# Patient Record
Sex: Female | Born: 1980 | Race: White | Hispanic: No | Marital: Single | Smoking: Never smoker
Health system: Southern US, Community
[De-identification: ages and names within clinical notes are randomized; demographics above are authoritative.]

---
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
## 1999-04-28 ENCOUNTER — Emergency Department (HOSPITAL_COMMUNITY): Admission: EM | Admit: 1999-04-28 | Discharge: 1999-04-28 | Payer: Self-pay | Admitting: Emergency Medicine

## 1999-04-28 ENCOUNTER — Encounter: Payer: Self-pay | Admitting: Emergency Medicine

## 1999-05-17 ENCOUNTER — Emergency Department (HOSPITAL_COMMUNITY): Admission: EM | Admit: 1999-05-17 | Discharge: 1999-05-17 | Payer: Self-pay | Admitting: Emergency Medicine

## 2000-07-25 ENCOUNTER — Other Ambulatory Visit: Admission: RE | Admit: 2000-07-25 | Discharge: 2000-07-25 | Payer: Self-pay | Admitting: *Deleted

## 2000-08-22 ENCOUNTER — Other Ambulatory Visit: Admission: RE | Admit: 2000-08-22 | Discharge: 2000-08-22 | Payer: Self-pay | Admitting: *Deleted

## 2001-05-16 ENCOUNTER — Other Ambulatory Visit: Admission: RE | Admit: 2001-05-16 | Discharge: 2001-05-16 | Payer: Self-pay | Admitting: *Deleted

## 2002-06-28 ENCOUNTER — Other Ambulatory Visit: Admission: RE | Admit: 2002-06-28 | Discharge: 2002-06-28 | Payer: Self-pay | Admitting: *Deleted

## 2003-08-02 ENCOUNTER — Other Ambulatory Visit: Admission: RE | Admit: 2003-08-02 | Discharge: 2003-08-02 | Payer: Self-pay | Admitting: *Deleted

## 2004-08-05 ENCOUNTER — Other Ambulatory Visit: Admission: RE | Admit: 2004-08-05 | Discharge: 2004-08-05 | Payer: Self-pay | Admitting: Obstetrics and Gynecology

## 2005-08-09 ENCOUNTER — Other Ambulatory Visit: Admission: RE | Admit: 2005-08-09 | Discharge: 2005-08-09 | Payer: Self-pay | Admitting: Obstetrics and Gynecology

## 2006-09-16 ENCOUNTER — Other Ambulatory Visit: Admission: RE | Admit: 2006-09-16 | Discharge: 2006-09-16 | Payer: Self-pay | Admitting: Obstetrics and Gynecology

## 2008-03-18 ENCOUNTER — Emergency Department (HOSPITAL_BASED_OUTPATIENT_CLINIC_OR_DEPARTMENT_OTHER): Admission: EM | Admit: 2008-03-18 | Discharge: 2008-03-18 | Payer: Self-pay | Admitting: Emergency Medicine

## 2009-04-24 ENCOUNTER — Inpatient Hospital Stay (HOSPITAL_COMMUNITY): Admission: AD | Admit: 2009-04-24 | Discharge: 2009-04-28 | Payer: Self-pay | Admitting: Obstetrics and Gynecology

## 2009-09-03 IMAGING — CT CT HEAD W/O CM
2 series · 16 of 30 positions shown, 18 images · non-contrast
Comparison: None

CLINICAL DATA: Seizure.

CT HEAD WITHOUT CONTRAST
TECHNIQUE: Contiguous axial images were obtained from the base of
the skull through the vertex without contrast.

[Series 2: head 4.8 h37s · axial · 0.45mm/px · z∈[+934,+1054]mm · 8 of 32 slices shown, 10 images]
[im 4/32  brain]
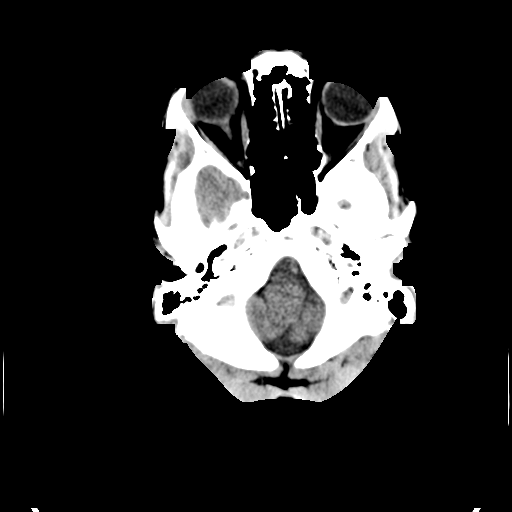
[im 4/32  bone]
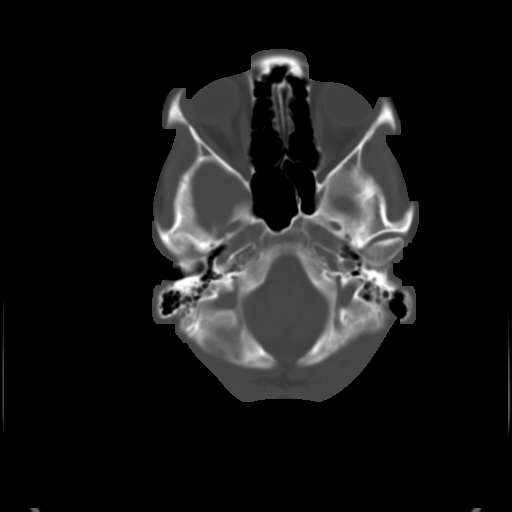
[im 7/32  brain]
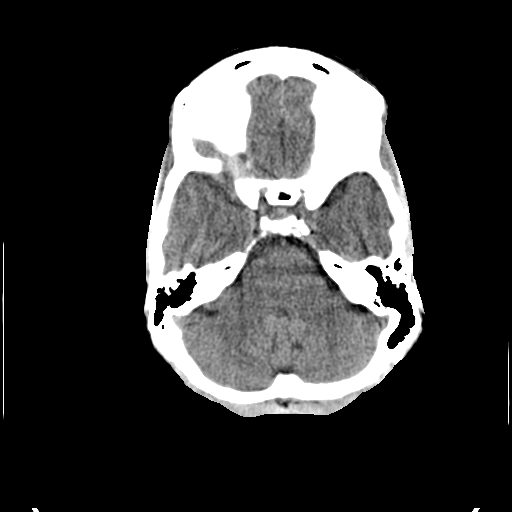
[im 11/32  brain]
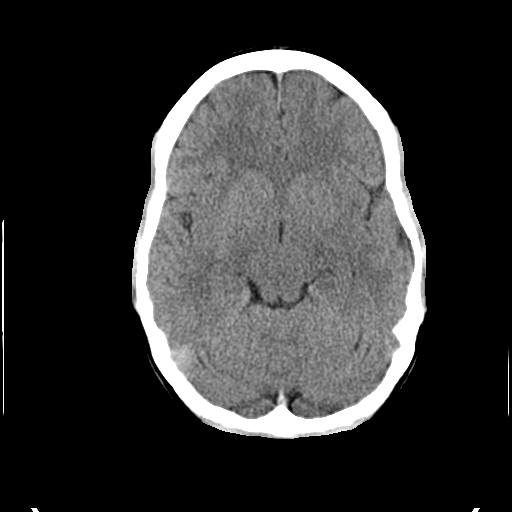
[im 14/32  brain]
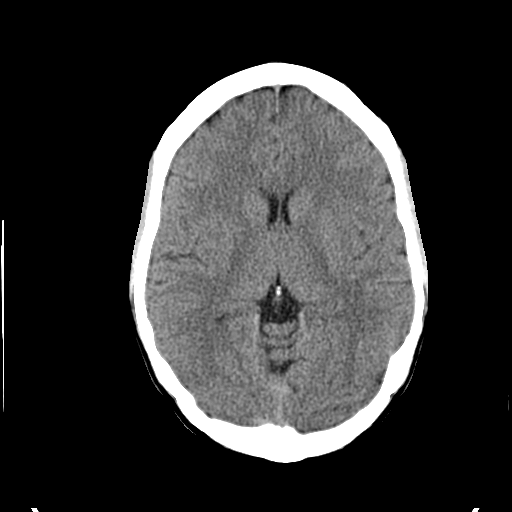
[im 18/32  brain]
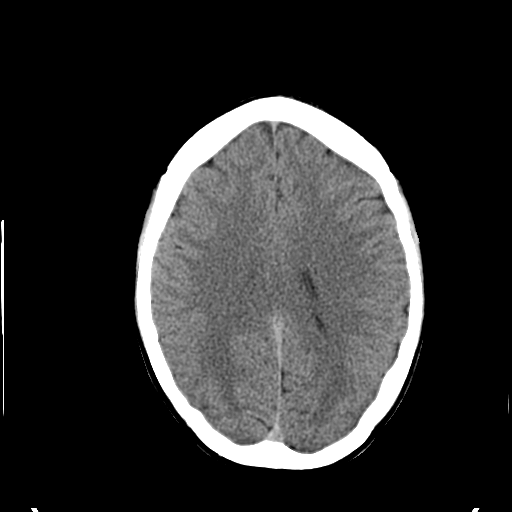
[im 18/32  bone]
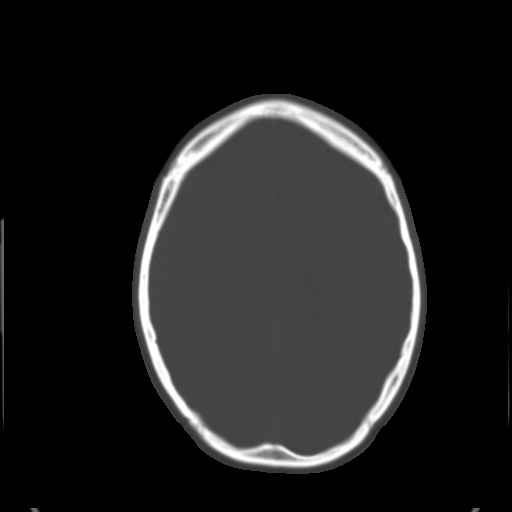
[im 21/32  brain]
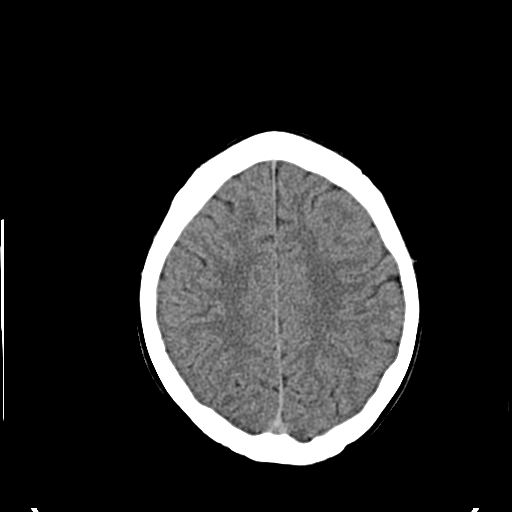
[im 25/32  brain]
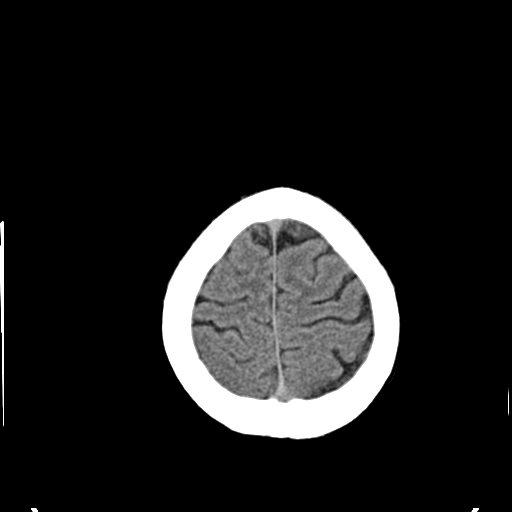
[im 28/32  brain]
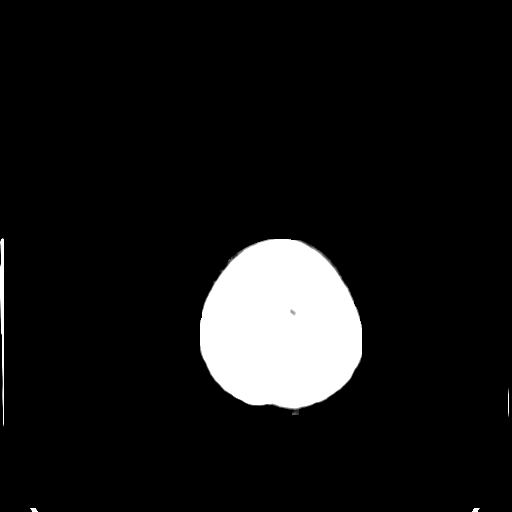

[Series 3: head 2.4 h60s bone · axial · 0.45mm/px · z∈[+933,+1058]mm · 8 of 64 slices shown]
[im 7/64  bone]
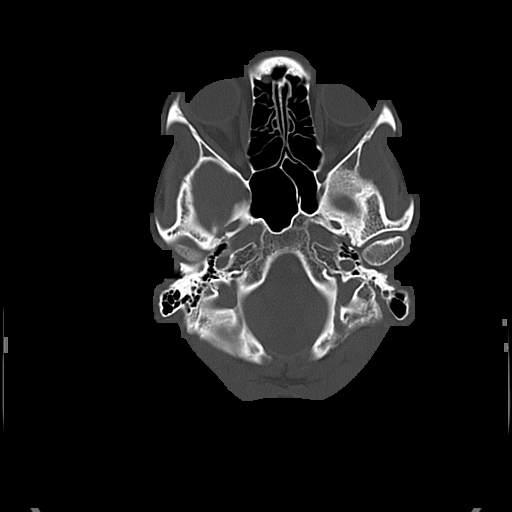
[im 14/64  bone]
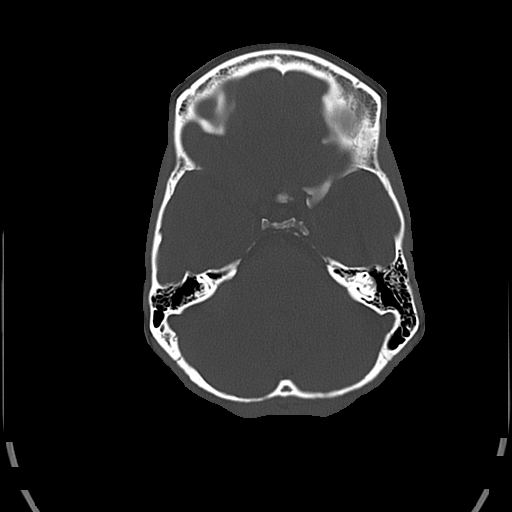
[im 20/64  bone]
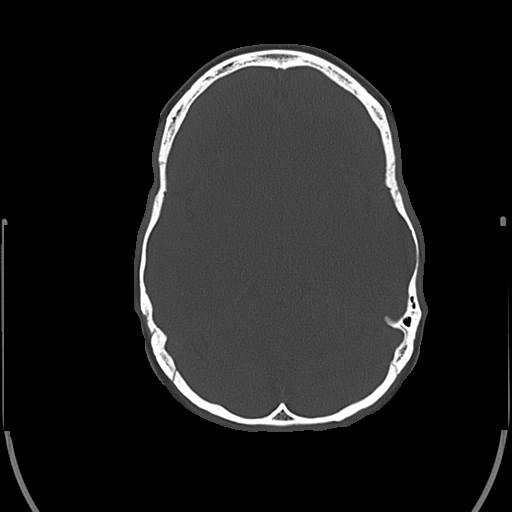
[im 27/64  bone]
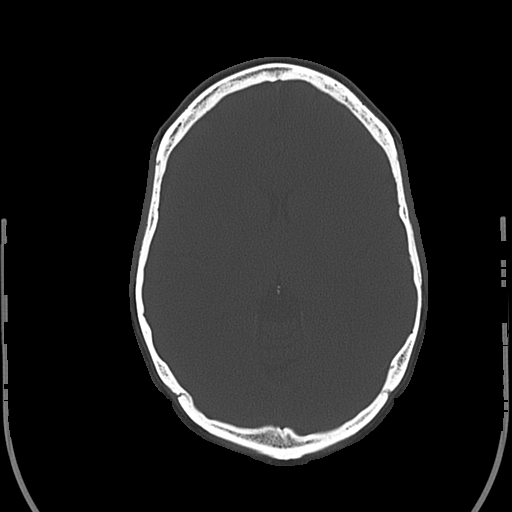
[im 37/64  bone]
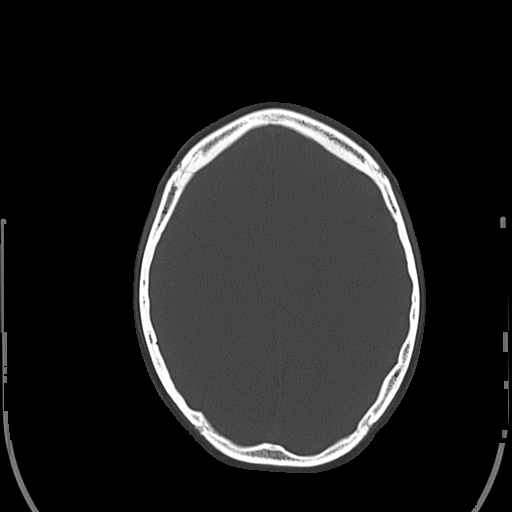
[im 44/64  bone]
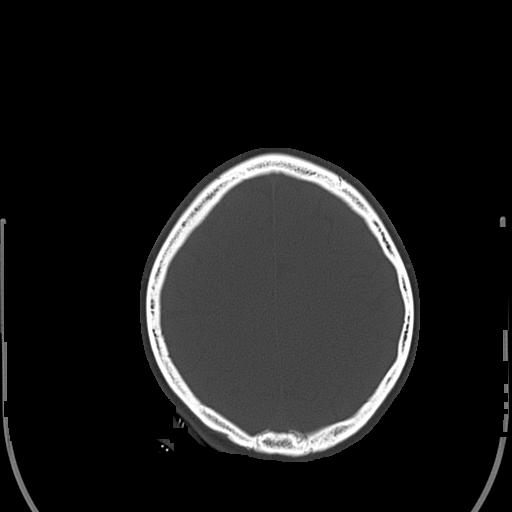
[im 50/64  bone]
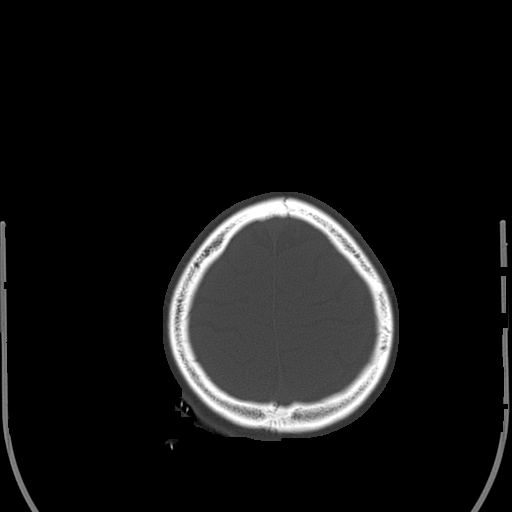
[im 57/64  bone]
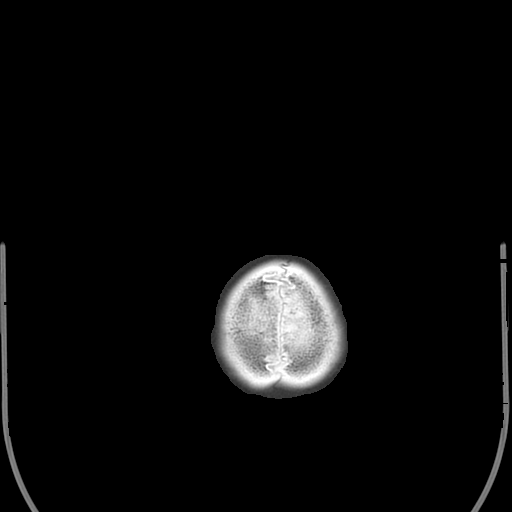

[16 of 30 positions shown; findings below may reference images not displayed]

FINDINGS: No acute intracranial abnormalities are identified,
including mass lesion or mass effect, hydrocephalus, extra-axial
fluid collection, midline shift, hemorrhage, or acute infarction.
Please note that acute infarction may be occult on CT for 24-48
hours.

The visualized bony calvarium is unremarkable.
IMPRESSION: No evidence of acute intracranial abnormality.

## 2010-09-09 LAB — RPR: RPR Ser Ql: NONREACTIVE

## 2010-09-09 LAB — CBC
HCT: 37.3 % (ref 36.0–46.0)
HCT: 40.8 % (ref 36.0–46.0)
Hemoglobin: 12.8 g/dL (ref 12.0–15.0)
Hemoglobin: 13.8 g/dL (ref 12.0–15.0)
MCHC: 33.8 g/dL (ref 30.0–36.0)
MCHC: 34.2 g/dL (ref 30.0–36.0)
MCV: 95.6 fL (ref 78.0–100.0)
MCV: 96.4 fL (ref 78.0–100.0)
Platelets: 150 10*3/uL (ref 150–400)
Platelets: 182 10*3/uL (ref 150–400)
RBC: 3.87 MIL/uL (ref 3.87–5.11)
RBC: 4.27 MIL/uL (ref 3.87–5.11)
RDW: 13.6 % (ref 11.5–15.5)
RDW: 13.6 % (ref 11.5–15.5)
WBC: 10.9 10*3/uL — ABNORMAL HIGH (ref 4.0–10.5)
WBC: 8.2 10*3/uL (ref 4.0–10.5)

## 2010-09-09 LAB — RH IMMUNE GLOB WKUP(>/=20WKS)(NOT WOMEN'S HOSP)

## 2011-03-09 LAB — COMPREHENSIVE METABOLIC PANEL
AST: 37
BUN: 13
CO2: 26
Chloride: 102
Creatinine, Ser: 0.7
GFR calc Af Amer: >60
GFR calc non Af Amer: >60
Total Bilirubin: 0.5

## 2011-03-09 LAB — URINE MICROSCOPIC-ADD ON

## 2011-03-09 LAB — URINALYSIS, ROUTINE W REFLEX MICROSCOPIC
Glucose, UA: NEGATIVE
Hgb urine dipstick: NEGATIVE
Protein, ur: 100 — AB

## 2011-03-09 LAB — DIFFERENTIAL
Basophils Absolute: 0
Lymphocytes Relative: 5 — ABNORMAL LOW
Monocytes Relative: 3
Neutrophils Relative %: 92 — ABNORMAL HIGH

## 2011-03-09 LAB — CBC
Hemoglobin: 14.1
Platelets: 247
RDW: 12.8

## 2011-03-09 LAB — POCT TOXICOLOGY PANEL

## 2011-03-09 LAB — PROTIME-INR: INR: 1.1

## 2011-07-19 ENCOUNTER — Other Ambulatory Visit: Payer: Self-pay | Admitting: Physician Assistant

## 2011-07-19 ENCOUNTER — Ambulatory Visit
Admission: RE | Admit: 2011-07-19 | Discharge: 2011-07-19 | Disposition: A | Discharge status: Home / Self Care | Payer: Medicaid Other | Source: Ambulatory Visit | Attending: Physician Assistant | Admitting: Physician Assistant

## 2012-01-17 ENCOUNTER — Other Ambulatory Visit: Payer: Self-pay | Admitting: Family Medicine

## 2012-01-17 DIAGNOSIS — R1013 Epigastric pain: Secondary | ICD-10-CM

## 2012-01-18 ENCOUNTER — Ambulatory Visit
Admission: RE | Admit: 2012-01-18 | Discharge: 2012-01-18 | Disposition: A | Discharge status: Home / Self Care | Payer: Medicaid Other | Source: Ambulatory Visit | Attending: Family Medicine | Admitting: Family Medicine

## 2012-01-18 DIAGNOSIS — R1013 Epigastric pain: Secondary | ICD-10-CM

## 2012-01-21 ENCOUNTER — Other Ambulatory Visit (HOSPITAL_COMMUNITY): Payer: Self-pay | Admitting: Family Medicine

## 2012-01-21 DIAGNOSIS — R110 Nausea: Secondary | ICD-10-CM

## 2012-02-01 ENCOUNTER — Encounter (HOSPITAL_COMMUNITY)
Admission: RE | Admit: 2012-02-01 | Discharge: 2012-02-01 | Disposition: A | Discharge status: Home / Self Care | Payer: Medicaid Other | Source: Ambulatory Visit | Attending: Family Medicine | Admitting: Family Medicine

## 2012-02-01 DIAGNOSIS — R110 Nausea: Secondary | ICD-10-CM

## 2012-02-01 MED ORDER — TECHNETIUM TC 99M SULFUR COLLOID
2.2000 | Freq: Once | INTRAVENOUS | Status: AC | PRN
  Administered 2012-02-01: 2.2 via INTRAVENOUS

## 2012-02-25 ENCOUNTER — Other Ambulatory Visit (HOSPITAL_COMMUNITY): Payer: Self-pay | Admitting: Gastroenterology

## 2012-02-25 DIAGNOSIS — R109 Unspecified abdominal pain: Secondary | ICD-10-CM

## 2012-03-06 ENCOUNTER — Encounter (HOSPITAL_COMMUNITY)
Admission: RE | Admit: 2012-03-06 | Discharge: 2012-03-06 | Disposition: A | Discharge status: Home / Self Care | Payer: Medicaid Other | Source: Ambulatory Visit | Attending: Gastroenterology | Admitting: Gastroenterology

## 2012-03-06 DIAGNOSIS — R109 Unspecified abdominal pain: Secondary | ICD-10-CM

## 2012-03-06 MED ORDER — TECHNETIUM TC 99M MEBROFENIN IV KIT
5.0000 | PACK | Freq: Once | INTRAVENOUS | Status: AC | PRN
  Administered 2012-03-06: 5 via INTRAVENOUS

## 2012-03-06 MED ORDER — SINCALIDE 5 MCG IJ SOLR
0.0200 ug/kg | Freq: Once | INTRAMUSCULAR | Status: AC
  Administered 2012-03-06: 0.9 ug via INTRAVENOUS

## 2012-03-06 NOTE — Therapy (Signed)
Kinevac administered to patient Kayla Hudson per radiology protocol. Patient identifiers used prior to given medication and dosage verified by Robin Searing RN.

## 2013-08-22 IMAGING — NM NM HEPATO W/GB/PHARM/[PERSON_NAME]
2 series · 12 of 12 positions shown · non-contrast
Comparison: Ultrasound 01/18/2012

CLINICAL DATA: Abdominal pain

NUCLEAR MEDICINE HEPATOBILIARY IMAGING WITH GALLBLADDER EF
TECHNIQUE: Sequential images of the abdomen were obtained [DATE]
minutes following intravenous administration of
radiopharmaceutical.  After slow intravenous infusion of 0.9 ucg
Cholecystokinin, gallbladder ejection fraction was determined.
Radiopharmaceutical:  5.0 mCi Hc-RRm Choletec

[he hepatobiliary · 3.43mm/px · 6 of 54 frames shown (1 of 2)]
[frame 5/54]
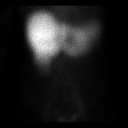
[frame 14/54]
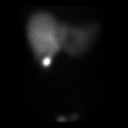
[frame 23/54]
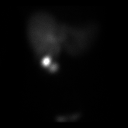
[frame 32/54]
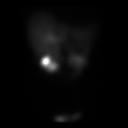
[frame 41/54]
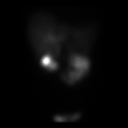
[frame 50/54]
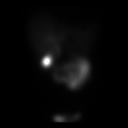

[he hepatobiliary · 3.43mm/px · 6 of 30 frames shown (2 of 2)]
[frame 3/30]
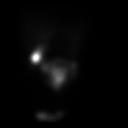
[frame 8/30]
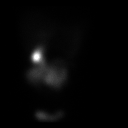
[frame 13/30]
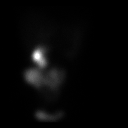
[frame 18/30]
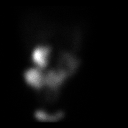
[frame 23/30]
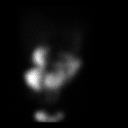
[frame 28/30]
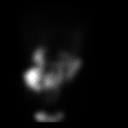

[12 of 12 positions shown; findings below may reference images not displayed]

FINDINGS: There is prompt extraction of radiotracer from the blood
pool and homogeneous uptake within the liver.  The gallbladder is
evident by 15 minutes.  Counts are present within the bowel by 35
minutes.  Upon administration of a cholecystokinin, the gallbladder
contracts appropriately with a calculated ejection fraction = 79%
at 30 min (Normal greater than 30 % ejection).
IMPRESSION: 1. Normal gallbladder ejection fraction =  79%.
2. Patent cystic duct and common bile duct.

## 2015-10-15 DIAGNOSIS — R51 Headache: Secondary | ICD-10-CM | POA: Diagnosis not present

## 2015-10-21 DIAGNOSIS — R51 Headache: Secondary | ICD-10-CM | POA: Diagnosis not present

## 2015-10-21 DIAGNOSIS — M545 Low back pain: Secondary | ICD-10-CM | POA: Diagnosis not present

## 2015-10-21 DIAGNOSIS — M542 Cervicalgia: Secondary | ICD-10-CM | POA: Diagnosis not present

## 2015-10-21 DIAGNOSIS — M25561 Pain in right knee: Secondary | ICD-10-CM | POA: Diagnosis not present

## 2015-10-24 DIAGNOSIS — M542 Cervicalgia: Secondary | ICD-10-CM | POA: Diagnosis not present

## 2015-10-24 DIAGNOSIS — R51 Headache: Secondary | ICD-10-CM | POA: Diagnosis not present

## 2015-10-29 DIAGNOSIS — M542 Cervicalgia: Secondary | ICD-10-CM | POA: Diagnosis not present

## 2015-10-29 DIAGNOSIS — R51 Headache: Secondary | ICD-10-CM | POA: Diagnosis not present

## 2015-11-04 DIAGNOSIS — M542 Cervicalgia: Secondary | ICD-10-CM | POA: Diagnosis not present

## 2015-11-04 DIAGNOSIS — R51 Headache: Secondary | ICD-10-CM | POA: Diagnosis not present

## 2015-11-06 DIAGNOSIS — R51 Headache: Secondary | ICD-10-CM | POA: Diagnosis not present

## 2015-11-06 DIAGNOSIS — M545 Low back pain: Secondary | ICD-10-CM | POA: Diagnosis not present

## 2015-11-06 DIAGNOSIS — M25561 Pain in right knee: Secondary | ICD-10-CM | POA: Diagnosis not present

## 2015-11-06 DIAGNOSIS — M542 Cervicalgia: Secondary | ICD-10-CM | POA: Diagnosis not present

## 2015-11-10 DIAGNOSIS — M545 Low back pain: Secondary | ICD-10-CM | POA: Diagnosis not present

## 2015-11-10 DIAGNOSIS — M25561 Pain in right knee: Secondary | ICD-10-CM | POA: Diagnosis not present

## 2015-11-10 DIAGNOSIS — R51 Headache: Secondary | ICD-10-CM | POA: Diagnosis not present

## 2015-11-10 DIAGNOSIS — M542 Cervicalgia: Secondary | ICD-10-CM | POA: Diagnosis not present

## 2015-11-18 DIAGNOSIS — M25561 Pain in right knee: Secondary | ICD-10-CM | POA: Diagnosis not present

## 2015-11-18 DIAGNOSIS — R51 Headache: Secondary | ICD-10-CM | POA: Diagnosis not present

## 2015-11-18 DIAGNOSIS — M542 Cervicalgia: Secondary | ICD-10-CM | POA: Diagnosis not present

## 2015-11-18 DIAGNOSIS — M545 Low back pain: Secondary | ICD-10-CM | POA: Diagnosis not present

## 2015-11-20 DIAGNOSIS — R51 Headache: Secondary | ICD-10-CM | POA: Diagnosis not present

## 2015-11-20 DIAGNOSIS — M25561 Pain in right knee: Secondary | ICD-10-CM | POA: Diagnosis not present

## 2015-11-20 DIAGNOSIS — M542 Cervicalgia: Secondary | ICD-10-CM | POA: Diagnosis not present

## 2015-11-20 DIAGNOSIS — M545 Low back pain: Secondary | ICD-10-CM | POA: Diagnosis not present

## 2015-11-24 DIAGNOSIS — R51 Headache: Secondary | ICD-10-CM | POA: Diagnosis not present

## 2015-11-24 DIAGNOSIS — M545 Low back pain: Secondary | ICD-10-CM | POA: Diagnosis not present

## 2015-11-24 DIAGNOSIS — M542 Cervicalgia: Secondary | ICD-10-CM | POA: Diagnosis not present

## 2015-11-24 DIAGNOSIS — M25561 Pain in right knee: Secondary | ICD-10-CM | POA: Diagnosis not present

## 2015-11-26 DIAGNOSIS — R51 Headache: Secondary | ICD-10-CM | POA: Diagnosis not present

## 2015-11-26 DIAGNOSIS — M545 Low back pain: Secondary | ICD-10-CM | POA: Diagnosis not present

## 2015-11-26 DIAGNOSIS — M542 Cervicalgia: Secondary | ICD-10-CM | POA: Diagnosis not present

## 2015-11-26 DIAGNOSIS — M25561 Pain in right knee: Secondary | ICD-10-CM | POA: Diagnosis not present

## 2015-12-01 DIAGNOSIS — R51 Headache: Secondary | ICD-10-CM | POA: Diagnosis not present

## 2015-12-01 DIAGNOSIS — M542 Cervicalgia: Secondary | ICD-10-CM | POA: Diagnosis not present

## 2015-12-01 DIAGNOSIS — M25561 Pain in right knee: Secondary | ICD-10-CM | POA: Diagnosis not present

## 2015-12-01 DIAGNOSIS — M545 Low back pain: Secondary | ICD-10-CM | POA: Diagnosis not present

## 2015-12-02 DIAGNOSIS — M25561 Pain in right knee: Secondary | ICD-10-CM | POA: Diagnosis not present

## 2015-12-02 DIAGNOSIS — M545 Low back pain: Secondary | ICD-10-CM | POA: Diagnosis not present

## 2015-12-02 DIAGNOSIS — M542 Cervicalgia: Secondary | ICD-10-CM | POA: Diagnosis not present

## 2015-12-02 DIAGNOSIS — R51 Headache: Secondary | ICD-10-CM | POA: Diagnosis not present

## 2015-12-16 DIAGNOSIS — M545 Low back pain: Secondary | ICD-10-CM | POA: Diagnosis not present

## 2015-12-16 DIAGNOSIS — R51 Headache: Secondary | ICD-10-CM | POA: Diagnosis not present

## 2015-12-16 DIAGNOSIS — M542 Cervicalgia: Secondary | ICD-10-CM | POA: Diagnosis not present

## 2015-12-16 DIAGNOSIS — M25561 Pain in right knee: Secondary | ICD-10-CM | POA: Diagnosis not present

## 2015-12-18 DIAGNOSIS — M545 Low back pain: Secondary | ICD-10-CM | POA: Diagnosis not present

## 2015-12-18 DIAGNOSIS — M542 Cervicalgia: Secondary | ICD-10-CM | POA: Diagnosis not present

## 2015-12-18 DIAGNOSIS — R51 Headache: Secondary | ICD-10-CM | POA: Diagnosis not present

## 2015-12-18 DIAGNOSIS — M25561 Pain in right knee: Secondary | ICD-10-CM | POA: Diagnosis not present

## 2015-12-23 DIAGNOSIS — M545 Low back pain: Secondary | ICD-10-CM | POA: Diagnosis not present

## 2015-12-23 DIAGNOSIS — M542 Cervicalgia: Secondary | ICD-10-CM | POA: Diagnosis not present

## 2015-12-23 DIAGNOSIS — M25561 Pain in right knee: Secondary | ICD-10-CM | POA: Diagnosis not present

## 2015-12-23 DIAGNOSIS — R51 Headache: Secondary | ICD-10-CM | POA: Diagnosis not present

## 2015-12-31 DIAGNOSIS — M542 Cervicalgia: Secondary | ICD-10-CM | POA: Diagnosis not present

## 2015-12-31 DIAGNOSIS — R51 Headache: Secondary | ICD-10-CM | POA: Diagnosis not present

## 2015-12-31 DIAGNOSIS — M545 Low back pain: Secondary | ICD-10-CM | POA: Diagnosis not present

## 2015-12-31 DIAGNOSIS — M25561 Pain in right knee: Secondary | ICD-10-CM | POA: Diagnosis not present

## 2016-01-06 DIAGNOSIS — M542 Cervicalgia: Secondary | ICD-10-CM | POA: Diagnosis not present

## 2016-01-06 DIAGNOSIS — M545 Low back pain: Secondary | ICD-10-CM | POA: Diagnosis not present

## 2016-01-06 DIAGNOSIS — M25561 Pain in right knee: Secondary | ICD-10-CM | POA: Diagnosis not present

## 2016-01-06 DIAGNOSIS — R51 Headache: Secondary | ICD-10-CM | POA: Diagnosis not present

## 2016-01-08 DIAGNOSIS — R51 Headache: Secondary | ICD-10-CM | POA: Diagnosis not present

## 2016-01-08 DIAGNOSIS — M25561 Pain in right knee: Secondary | ICD-10-CM | POA: Diagnosis not present

## 2016-01-08 DIAGNOSIS — M542 Cervicalgia: Secondary | ICD-10-CM | POA: Diagnosis not present

## 2016-01-08 DIAGNOSIS — M545 Low back pain: Secondary | ICD-10-CM | POA: Diagnosis not present

## 2016-01-13 DIAGNOSIS — M545 Low back pain: Secondary | ICD-10-CM | POA: Diagnosis not present

## 2016-01-13 DIAGNOSIS — R51 Headache: Secondary | ICD-10-CM | POA: Diagnosis not present

## 2016-01-13 DIAGNOSIS — M25561 Pain in right knee: Secondary | ICD-10-CM | POA: Diagnosis not present

## 2016-01-13 DIAGNOSIS — M542 Cervicalgia: Secondary | ICD-10-CM | POA: Diagnosis not present

## 2016-02-18 DIAGNOSIS — K582 Mixed irritable bowel syndrome: Secondary | ICD-10-CM | POA: Diagnosis not present

## 2016-02-24 DIAGNOSIS — J309 Allergic rhinitis, unspecified: Secondary | ICD-10-CM | POA: Diagnosis not present

## 2016-12-14 DIAGNOSIS — Z01419 Encounter for gynecological examination (general) (routine) without abnormal findings: Secondary | ICD-10-CM | POA: Diagnosis not present

## 2016-12-14 DIAGNOSIS — Z681 Body mass index (BMI) 19 or less, adult: Secondary | ICD-10-CM | POA: Diagnosis not present

## 2017-01-15 DIAGNOSIS — F900 Attention-deficit hyperactivity disorder, predominantly inattentive type: Secondary | ICD-10-CM | POA: Diagnosis not present

## 2017-01-15 DIAGNOSIS — F3174 Bipolar disorder, in full remission, most recent episode manic: Secondary | ICD-10-CM | POA: Diagnosis not present

## 2017-01-15 DIAGNOSIS — F3176 Bipolar disorder, in full remission, most recent episode depressed: Secondary | ICD-10-CM | POA: Diagnosis not present

## 2017-07-09 DIAGNOSIS — F900 Attention-deficit hyperactivity disorder, predominantly inattentive type: Secondary | ICD-10-CM | POA: Diagnosis not present

## 2017-07-09 DIAGNOSIS — F3174 Bipolar disorder, in full remission, most recent episode manic: Secondary | ICD-10-CM | POA: Diagnosis not present

## 2017-07-09 DIAGNOSIS — F3176 Bipolar disorder, in full remission, most recent episode depressed: Secondary | ICD-10-CM | POA: Diagnosis not present

## 2017-08-16 DIAGNOSIS — B009 Herpesviral infection, unspecified: Secondary | ICD-10-CM | POA: Diagnosis not present

## 2017-12-31 DIAGNOSIS — F900 Attention-deficit hyperactivity disorder, predominantly inattentive type: Secondary | ICD-10-CM | POA: Diagnosis not present

## 2017-12-31 DIAGNOSIS — F3181 Bipolar II disorder: Secondary | ICD-10-CM | POA: Diagnosis not present

## 2018-02-22 DIAGNOSIS — Z8041 Family history of malignant neoplasm of ovary: Secondary | ICD-10-CM | POA: Diagnosis not present

## 2018-02-22 DIAGNOSIS — Z803 Family history of malignant neoplasm of breast: Secondary | ICD-10-CM | POA: Diagnosis not present

## 2018-02-22 DIAGNOSIS — Z800 Family history of malignant neoplasm of digestive organs: Secondary | ICD-10-CM | POA: Diagnosis not present

## 2018-02-22 DIAGNOSIS — Z8049 Family history of malignant neoplasm of other genital organs: Secondary | ICD-10-CM | POA: Diagnosis not present

## 2018-02-22 DIAGNOSIS — Z01419 Encounter for gynecological examination (general) (routine) without abnormal findings: Secondary | ICD-10-CM | POA: Diagnosis not present

## 2018-02-22 DIAGNOSIS — Z681 Body mass index (BMI) 19 or less, adult: Secondary | ICD-10-CM | POA: Diagnosis not present

## 2018-03-09 DIAGNOSIS — Z0000 Encounter for general adult medical examination without abnormal findings: Secondary | ICD-10-CM | POA: Diagnosis not present

## 2018-03-09 DIAGNOSIS — Z136 Encounter for screening for cardiovascular disorders: Secondary | ICD-10-CM | POA: Diagnosis not present

## 2018-03-09 DIAGNOSIS — Z23 Encounter for immunization: Secondary | ICD-10-CM | POA: Diagnosis not present

## 2018-03-09 DIAGNOSIS — F319 Bipolar disorder, unspecified: Secondary | ICD-10-CM | POA: Diagnosis not present

## 2018-04-10 DIAGNOSIS — M816 Localized osteoporosis [Lequesne]: Secondary | ICD-10-CM | POA: Diagnosis not present

## 2018-04-10 DIAGNOSIS — N921 Excessive and frequent menstruation with irregular cycle: Secondary | ICD-10-CM | POA: Diagnosis not present

## 2018-04-20 DIAGNOSIS — M818 Other osteoporosis without current pathological fracture: Secondary | ICD-10-CM | POA: Diagnosis not present

## 2018-06-17 DIAGNOSIS — F3181 Bipolar II disorder: Secondary | ICD-10-CM | POA: Diagnosis not present

## 2018-08-08 DIAGNOSIS — B349 Viral infection, unspecified: Secondary | ICD-10-CM | POA: Diagnosis not present

## 2021-05-21 ENCOUNTER — Other Ambulatory Visit: Payer: Self-pay | Admitting: Obstetrics and Gynecology

## 2021-05-21 DIAGNOSIS — R928 Other abnormal and inconclusive findings on diagnostic imaging of breast: Secondary | ICD-10-CM

## 2021-07-02 ENCOUNTER — Ambulatory Visit
Admission: RE | Admit: 2021-07-02 | Discharge: 2021-07-02 | Disposition: A | Discharge status: Home / Self Care | Payer: Self-pay | Source: Ambulatory Visit | Attending: Obstetrics and Gynecology | Admitting: Obstetrics and Gynecology

## 2021-07-02 ENCOUNTER — Ambulatory Visit
Admission: RE | Admit: 2021-07-02 | Discharge: 2021-07-02 | Disposition: A | Discharge status: Home / Self Care | Payer: Medicaid Other | Source: Ambulatory Visit | Attending: Obstetrics and Gynecology | Admitting: Obstetrics and Gynecology

## 2021-07-02 DIAGNOSIS — R928 Other abnormal and inconclusive findings on diagnostic imaging of breast: Secondary | ICD-10-CM

## 2022-12-24 ENCOUNTER — Ambulatory Visit
Admission: RE | Admit: 2022-12-24 | Discharge: 2022-12-24 | Disposition: A | Discharge status: Home / Self Care | Payer: Medicaid Other | Source: Ambulatory Visit | Attending: Physician Assistant | Admitting: Physician Assistant

## 2022-12-24 ENCOUNTER — Other Ambulatory Visit: Payer: Self-pay | Admitting: Physician Assistant

## 2022-12-24 DIAGNOSIS — H532 Diplopia: Secondary | ICD-10-CM

## 2022-12-24 MED ORDER — IOPAMIDOL (ISOVUE-300) INJECTION 61%
75.0000 mL | Freq: Once | INTRAVENOUS | Status: AC | PRN
  Administered 2022-12-24: 75 mL via INTRAVENOUS

## 2022-12-28 ENCOUNTER — Other Ambulatory Visit (INDEPENDENT_AMBULATORY_CARE_PROVIDER_SITE_OTHER): Payer: Medicaid Other

## 2022-12-28 ENCOUNTER — Encounter: Payer: Self-pay | Admitting: Neurology

## 2022-12-28 ENCOUNTER — Ambulatory Visit: Payer: Medicaid Other | Admitting: Neurology

## 2022-12-28 VITALS — BP 103/64 | HR 67 | Ht 63.0 in | Wt 106.0 lb

## 2022-12-28 DIAGNOSIS — H539 Unspecified visual disturbance: Secondary | ICD-10-CM | POA: Diagnosis not present

## 2022-12-28 DIAGNOSIS — H532 Diplopia: Secondary | ICD-10-CM

## 2022-12-28 LAB — B12 AND FOLATE PANEL
Folate: >23.8 ng/mL (ref >5.9)
Vitamin B-12: 1042 pg/mL — ABNORMAL HIGH (ref 211–911)

## 2022-12-28 LAB — TSH: TSH: 1.99 u[IU]/mL (ref 0.35–5.50)

## 2022-12-28 LAB — SEDIMENTATION RATE: Sed Rate: 1 mm/hr (ref 0–20)

## 2022-12-28 LAB — C-REACTIVE PROTEIN: CRP: <1 mg/dL (ref 0.5–20.0)

## 2022-12-28 NOTE — Progress Notes (Signed)
[facility] Clinic Note - Initial Visit   Date: 12/28/2022   SYMPHONI HELBLING MRN: [id number] DOB: Aug 20, 1980   Dear Dr. Bosie Clos:   Thank you for your kind referral of MCKINSLEY KOELZER for consultation of vision changes. Although her history is well known to you, please allow [place] to reiterate it for the purpose of our medical record. The patient was accompanied to the clinic by self.   RHIANON ZABAWA is a 42 y.o. right-handed female with bipolar disorder and depression presenting for evaluation of vision changes.   IMPRESSION/PLAN: Vision changes suggestive of palinopsia and diplopia.  No evidence of ocular weakness on exam.  Neurological exam is normal. I am not sure what is causing these symptoms.  To evaluate for intracranial pathology, imaging will be ordered.  Complex migraine is also considered in the differential.   - Check MRI brain wwo contrast  - Check ESR, CRP, TSH, vitamin B12, folate, vitamin B1  Further recommendations pending results.   ------------------------------------------------------------- History of present illness: Starting on 12/21/2022, she had a spell of double vision lasting 15 minutes and resolved.  She had another episode the following two days.  The third day, she had double vision again associated with border around the objects in her vision which lasted a longer.  She felt that her legs were also weaker.  She saw an ophthalmologist who did not find any problems of the eye.   No associated headaches, difficulty with swallowing/speech, numbness/tingling.    She drinks 1-2 glasses of wine daily.  Nonsmoker.  Currently, she is not working. She lives at home with son.   Out-side paper records, electronic medical record, and images have been reviewed where available and summarized as:  CT headache wwo contrast 12/24/2022:  No evidence of acute intracranial abnormality.    History reviewed. No pertinent past medical  history.  History reviewed. No pertinent surgical history.   Medications:  Outpatient Encounter Medications as of 12/28/2022  Medication Sig   Cholecalciferol (VITAMIN D) 50 MCG (2000 UT) CAPS 1 capsule Orally Once a day   lamoTRIgine (LAMICTAL) 200 MG tablet Take 2 tablets by mouth daily.   Multiple Vitamins-Minerals (MULTIVITAMIN WOMEN) TABS 1 TABLET Orally ONCE A DAY   sertraline (ZOLOFT) 25 MG tablet Take 25 mg by mouth daily.   [DISCONTINUED] Ferrous Sulfate (IRON PO) Multi Vitamin   No facility-administered encounter medications on file as of 12/28/2022.    Allergies:  Allergies  Allergen Reactions   Ondansetron Hcl Other (See Comments)   Pseudoephedrine Hcl Other (See Comments)    Family History: Family History  Problem Relation Age of Onset   Osteoarthritis Mother    Osteoporosis Mother    Other Mother        Connective tissue disease & Lung issues   Hypertension Father    Skin cancer Father    Autoimmune disease Maternal Aunt    Thyroid disease Maternal Aunt    Heart attack Maternal Aunt    Thyroid disease Maternal Grandmother    Autoimmune disease Maternal Grandmother    Dementia Maternal Grandmother        Lewy Body Dementia   Stroke Maternal Grandfather        Eye stroke   Thyroid disease Paternal Grandmother    Aneurysm Paternal Grandmother    Stroke Paternal Grandfather     Social History: Social History   Tobacco Use   Smoking status: Never   Smokeless tobacco: Never  Substance Use Topics  Alcohol use: Yes    Comment: Daily wine 1-2 glasses   Drug use: Never   Social History   Social History Narrative   Are you right handed or left handed? Right Handed    Are you currently employed ? No    What is your current occupation?   Do you live at home alone? No    Who lives with you? With son    What type of home do you live in: 1 story or 2 story? One story home        Vital Signs:  BP 103/64   Pulse 67   Ht 5\' 3"  (1.6 m)   Wt 106 lb  (48.1 kg)   SpO2 99%   BMI 18.78 kg/m    Neurological Exam: MENTAL STATUS including orientation to time, place, person, recent and remote memory, attention span and concentration, language, and fund of knowledge is normal.  Speech is not dysarthric.  CRANIAL NERVES: II:  No visual field defects.     III-IV-VI: Pupils equal round and reactive to light.  Normal conjugate, extra-ocular eye movements in all directions of gaze.  No nystagmus.  No ptosis.   V:  Normal facial sensation.    VII:  Normal facial symmetry and movements.   VIII:  Normal hearing and vestibular function.   IX-X:  Normal palatal movement.   XI:  Normal shoulder shrug and head rotation.   XII:  Normal tongue strength and range of motion, no deviation or fasciculation.  MOTOR:  No atrophy, fasciculations or abnormal movements.  No pronator drift.   Upper Extremity:  Right  Left  Deltoid  5/5   5/5   Biceps  5/5   5/5   Triceps  5/5   5/5   Wrist extensors  5/5   5/5   Wrist flexors  5/5   5/5   Finger extensors  5/5   5/5   Finger flexors  5/5   5/5   Dorsal interossei  5/5   5/5   Abductor pollicis  5/5   5/5   Tone (Ashworth scale)  0  0   Lower Extremity:  Right  Left  Hip flexors  5/5   5/5   Hip extensors  5/5   5/5   Knee flexors  5/5   5/5   Knee extensors  5/5   5/5   Dorsiflexors  5/5   5/5   Plantarflexors  5/5   5/5   Toe extensors  5/5   5/5   Toe flexors  5/5   5/5   Tone (Ashworth scale)  0  0   MSRs:                                           Right        Left brachioradialis 2+  2+  biceps 2+  2+  triceps 2+  2+  patellar 2+  2+  ankle jerk 2+  2+  Hoffman no  no  plantar response down  down   SENSORY:  Normal and symmetric perception of light touch, pinprick, vibration, and temperature.  Romberg's sign absent.   COORDINATION/GAIT: Normal finger-to- nose-finger.  Intact rapid alternating movements bilaterally.  Able to rise from a chair without using arms.  Gait narrow based and  stable. Tandem and stressed gait intact.    Thank you  for allowing me to participate in patient's care.  If I can answer any additional questions, I would be pleased to do so.    Sincerely,    Muneer Leider K. Allena Katz, DO

## 2023-01-03 ENCOUNTER — Encounter: Payer: Self-pay | Admitting: Neurology

## 2023-01-06 ENCOUNTER — Other Ambulatory Visit: Payer: Medicaid Other

## 2023-01-12 ENCOUNTER — Ambulatory Visit
Admission: RE | Admit: 2023-01-12 | Discharge: 2023-01-12 | Disposition: A | Discharge status: Home / Self Care | Payer: Medicaid Other | Source: Ambulatory Visit | Attending: Neurology | Admitting: Neurology

## 2023-01-12 DIAGNOSIS — H539 Unspecified visual disturbance: Secondary | ICD-10-CM

## 2023-01-12 DIAGNOSIS — H532 Diplopia: Secondary | ICD-10-CM

## 2023-01-12 MED ORDER — GADOPICLENOL 0.5 MMOL/ML IV SOLN
5.0000 mL | Freq: Once | INTRAVENOUS | Status: AC | PRN
  Administered 2023-01-12: 5 mL via INTRAVENOUS

## 2023-01-18 ENCOUNTER — Encounter: Payer: Self-pay | Admitting: Neurology

## 2023-03-08 ENCOUNTER — Encounter: Payer: Self-pay | Admitting: Neurology

## 2023-03-08 ENCOUNTER — Ambulatory Visit: Payer: Medicaid Other | Admitting: Neurology

## 2023-03-08 VITALS — BP 115/71 | HR 66 | Ht 63.0 in | Wt 97.0 lb

## 2023-03-08 DIAGNOSIS — H539 Unspecified visual disturbance: Secondary | ICD-10-CM | POA: Diagnosis not present

## 2023-03-08 NOTE — Progress Notes (Signed)
Follow-up Visit   Date: 03/08/2023    Kayla Hudson MRN: [id number] DOB: August 04, 1980    Kayla Hudson is a 42 y.o. right-handed Caucasian female with bipolar disorder and depression returning to the clinic for follow-up of vision changes.  The patient was accompanied to the clinic by self.   IMPRESSION/PLAN: Double vision, resolved. MRI brain was personally viewed and is normal.  Exam is normal.  Possible her symptoms were migraine variant.  Continue to monitor.  2.  Poor appetite with weight loss.  Patient is underweight (BMI 17).  Recommend follow-up with PCP to discuss options for appetite stimulant  Return to clinic as needed --------------------------------------------- History of present illness: Starting on 12/21/2022, she had a spell of double vision lasting 15 minutes and resolved.  She had another episode the following two days.  The third day, she had double vision again associated with border around the objects in her vision which lasted a longer.  She felt that her legs were also weaker.  She saw an ophthalmologist who did not find any problems of the eye.    No associated headaches, difficulty with swallowing/speech, numbness/tingling.     She drinks 1-2 glasses of wine daily.  Nonsmoker.  Currently, she is not working. She lives at home with son.   UPDATE 03/08/2023:  She is here for follow-up visit.  MRI brain was normal.  She reports that double vision has not occurred since she was last here.  No new complaints. She does report having weight loss and poor appetite.    Medications:  Current Outpatient Medications on File Prior to Visit  Medication Sig Dispense Refill   Cholecalciferol (VITAMIN D) 50 MCG (2000 UT) CAPS 1 capsule Orally Once a day     lamoTRIgine (LAMICTAL) 200 MG tablet Take 2 tablets by mouth daily.     Multiple Vitamins-Minerals (MULTIVITAMIN WOMEN) TABS 1 TABLET Orally ONCE A DAY     sertraline (ZOLOFT) 25 MG tablet Take 25 mg by mouth  daily.     No current facility-administered medications on file prior to visit.    Allergies:  Allergies  Allergen Reactions   Ondansetron Hcl Other (See Comments)   Pseudoephedrine Hcl Other (See Comments)    Vital Signs:  BP 115/71   Pulse 66   Ht 5\' 3"  (1.6 m)   Wt 97 lb (44 kg)   SpO2 100%   BMI 17.18 kg/m   Neurological Exam: MENTAL STATUS including orientation to time, place, person, recent and remote memory, attention span and concentration, language, and fund of knowledge is normal.  Speech is not dysarthric.  CRANIAL NERVES:  No visual field defects.  Pupils equal round and reactive to light.  Normal conjugate, extra-ocular eye movements in all directions of gaze.  No ptosis .  Face is symmetric. Palate elevates symmetrically.  Tongue is midline.  MOTOR:  Motor strength is 5/5 in all extremities.  No atrophy, fasciculations or abnormal movements.  No pronator drift.  Tone is normal.    MSRs:  Reflexes are 2+/4 throughout.  SENSORY:  Intact to vibration throughout.  COORDINATION/GAIT:  Normal finger-to- nose-finger.  Intact rapid alternating movements bilaterally.  Gait narrow based and stable.   Data: MRI brain wwo contrast 01/22/2023: Normal brain MRI for patient age.  No acute abnormality.     Thank you for allowing me to participate in patient's care.  If I can answer any additional questions, I would be pleased to do so.  Sincerely,    Trella Thurmond K. Allena Katz, DO

## 2023-12-29 ENCOUNTER — Other Ambulatory Visit: Payer: Self-pay | Admitting: Obstetrics and Gynecology

## 2023-12-29 DIAGNOSIS — N6002 Solitary cyst of left breast: Secondary | ICD-10-CM

## 2023-12-30 ENCOUNTER — Ambulatory Visit
Admission: RE | Admit: 2023-12-30 | Discharge: 2023-12-30 | Disposition: A | Discharge status: Home / Self Care | Source: Ambulatory Visit | Attending: Obstetrics and Gynecology | Admitting: Obstetrics and Gynecology

## 2023-12-30 ENCOUNTER — Other Ambulatory Visit: Payer: Self-pay | Admitting: Obstetrics and Gynecology

## 2023-12-30 DIAGNOSIS — N6002 Solitary cyst of left breast: Secondary | ICD-10-CM

## 2024-01-06 ENCOUNTER — Other Ambulatory Visit: Payer: Self-pay | Admitting: Obstetrics and Gynecology

## 2024-01-06 ENCOUNTER — Encounter: Payer: Self-pay | Admitting: Obstetrics and Gynecology

## 2024-01-06 ENCOUNTER — Ambulatory Visit
Admission: RE | Admit: 2024-01-06 | Discharge: 2024-01-06 | Disposition: A | Discharge status: Home / Self Care | Source: Ambulatory Visit | Attending: Obstetrics and Gynecology | Admitting: Obstetrics and Gynecology

## 2024-01-06 DIAGNOSIS — N6002 Solitary cyst of left breast: Secondary | ICD-10-CM

## 2024-01-06 HISTORY — PX: BREAST BIOPSY: SHX20

## 2024-01-09 LAB — SURGICAL PATHOLOGY

## 2024-01-20 ENCOUNTER — Other Ambulatory Visit

## 2024-01-20 ENCOUNTER — Encounter
